# Patient Record
Sex: Male | Born: 1985 | Race: Black or African American | Hispanic: No | Marital: Single
Health system: Southern US, Community
[De-identification: ages and names within clinical notes are randomized; demographics above are authoritative.]

---

## 2005-07-14 ENCOUNTER — Ambulatory Visit (HOSPITAL_BASED_OUTPATIENT_CLINIC_OR_DEPARTMENT_OTHER): Admission: RE | Admit: 2005-07-14 | Discharge: 2005-07-15 | Payer: Self-pay | Admitting: Orthopedic Surgery

## 2005-07-14 ENCOUNTER — Ambulatory Visit (HOSPITAL_COMMUNITY): Admission: RE | Admit: 2005-07-14 | Discharge: 2005-07-14 | Payer: Self-pay | Admitting: Orthopedic Surgery

## 2006-05-24 ENCOUNTER — Ambulatory Visit (HOSPITAL_COMMUNITY): Admission: RE | Admit: 2006-05-24 | Discharge: 2006-05-24 | Payer: Self-pay | Admitting: Orthopedic Surgery

## 2009-10-08 ENCOUNTER — Emergency Department (HOSPITAL_COMMUNITY): Admission: EM | Admit: 2009-10-08 | Discharge: 2009-10-08 | Payer: Self-pay | Admitting: Emergency Medicine
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
## 2017-07-30 ENCOUNTER — Other Ambulatory Visit: Payer: Self-pay

## 2017-07-30 ENCOUNTER — Emergency Department (HOSPITAL_COMMUNITY): Payer: No Typology Code available for payment source

## 2017-07-30 ENCOUNTER — Emergency Department (HOSPITAL_COMMUNITY)
Admission: EM | Admit: 2017-07-30 | Discharge: 2017-07-30 | Disposition: A | Discharge status: Home / Self Care | Payer: No Typology Code available for payment source | Attending: Emergency Medicine | Admitting: Emergency Medicine

## 2017-07-30 ENCOUNTER — Encounter (HOSPITAL_COMMUNITY): Payer: Self-pay

## 2017-07-30 DIAGNOSIS — S7001XA Contusion of right hip, initial encounter: Secondary | ICD-10-CM

## 2017-07-30 DIAGNOSIS — Y999 Unspecified external cause status: Secondary | ICD-10-CM | POA: Diagnosis not present

## 2017-07-30 DIAGNOSIS — R51 Headache: Secondary | ICD-10-CM | POA: Insufficient documentation

## 2017-07-30 DIAGNOSIS — S79911A Unspecified injury of right hip, initial encounter: Secondary | ICD-10-CM | POA: Diagnosis present

## 2017-07-30 DIAGNOSIS — S7011XA Contusion of right thigh, initial encounter: Secondary | ICD-10-CM | POA: Insufficient documentation

## 2017-07-30 DIAGNOSIS — Y9389 Activity, other specified: Secondary | ICD-10-CM | POA: Diagnosis not present

## 2017-07-30 DIAGNOSIS — R52 Pain, unspecified: Secondary | ICD-10-CM

## 2017-07-30 DIAGNOSIS — R1084 Generalized abdominal pain: Secondary | ICD-10-CM | POA: Diagnosis not present

## 2017-07-30 DIAGNOSIS — Y929 Unspecified place or not applicable: Secondary | ICD-10-CM | POA: Insufficient documentation

## 2017-07-30 LAB — I-STAT CHEM 8, ED
BUN: 14 mg/dL (ref 6–20)
CALCIUM ION: 1.16 mmol/L (ref 1.15–1.40)
Chloride: 107 mmol/L (ref 101–111)
Creatinine, Ser: 1.1 mg/dL (ref 0.61–1.24)
GLUCOSE: 103 mg/dL — AB (ref 65–99)
HCT: 46 % (ref 39.0–52.0)
Hemoglobin: 15.6 g/dL (ref 13.0–17.0)
Potassium: 4.2 mmol/L (ref 3.5–5.1)
SODIUM: 140 mmol/L (ref 135–145)
TCO2: 25 mmol/L (ref 22–32)

## 2017-07-30 LAB — CBC WITH DIFFERENTIAL/PLATELET
Basophils Absolute: 0.1 10*3/uL (ref 0.0–0.1)
Basophils Relative: 1 %
EOS ABS: 0.1 10*3/uL (ref 0.0–0.7)
EOS PCT: 1 %
HCT: 45.4 % (ref 39.0–52.0)
Hemoglobin: 15.2 g/dL (ref 13.0–17.0)
LYMPHS ABS: 2.1 10*3/uL (ref 0.7–4.0)
Lymphocytes Relative: 28 %
MCH: 30.5 pg (ref 26.0–34.0)
MCHC: 33.5 g/dL (ref 30.0–36.0)
MCV: 91.2 fL (ref 78.0–100.0)
MONO ABS: 0.3 10*3/uL (ref 0.1–1.0)
MONOS PCT: 4 %
Neutro Abs: 4.8 10*3/uL (ref 1.7–7.7)
Neutrophils Relative %: 66 %
PLATELETS: 201 10*3/uL (ref 150–400)
RBC: 4.98 MIL/uL (ref 4.22–5.81)
RDW: 13.6 % (ref 11.5–15.5)
WBC: 7.3 10*3/uL (ref 4.0–10.5)

## 2017-07-30 LAB — CBG MONITORING, ED: Glucose-Capillary: 111 mg/dL — ABNORMAL HIGH (ref 65–99)

## 2017-07-30 MED ORDER — FENTANYL CITRATE (PF) 100 MCG/2ML IJ SOLN
50.0000 ug | Freq: Once | INTRAMUSCULAR | Status: AC
  Administered 2017-07-30: 50 ug via INTRAVENOUS
  Filled 2017-07-30: qty 2

## 2017-07-30 MED ORDER — NAPROXEN 250 MG PO TABS
500.0000 mg | ORAL_TABLET | Freq: Once | ORAL | Status: AC
  Administered 2017-07-30: 500 mg via ORAL
  Filled 2017-07-30: qty 2

## 2017-07-30 MED ORDER — LIDOCAINE HCL 1 % IJ SOLN
INTRAMUSCULAR | Status: AC
  Filled 2017-07-30: qty 20

## 2017-07-30 MED ORDER — IBUPROFEN 600 MG PO TABS: 600.0000 mg | ORAL_TABLET | Freq: Four times a day (QID) | ORAL | 0 refills | Status: AC | PRN

## 2017-07-30 MED ORDER — HYDROCODONE-ACETAMINOPHEN 5-325 MG PO TABS
1.0000 | ORAL_TABLET | Freq: Once | ORAL | Status: AC
  Administered 2017-07-30: 1 via ORAL
  Filled 2017-07-30: qty 1

## 2017-07-30 MED ORDER — LIDOCAINE-EPINEPHRINE 1 %-1:100000 IJ SOLN
INTRAMUSCULAR | Status: AC
  Filled 2017-07-30: qty 1
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
## 2017-07-30 NOTE — ED Notes (Signed)
ED Provider at bedside. 
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
## 2017-07-30 NOTE — ED Notes (Signed)
Pt ambulated. Able to bear weight but with difficulty.  MD notified.

## 2017-07-30 NOTE — ED Notes (Signed)
Ice applied

## 2017-07-30 NOTE — Progress Notes (Signed)
Orthopedic Tech Progress Note Patient Details:  Adam FoundHerbert Wilkerson 24-Feb-1986 [id number]  Ortho Devices Type of Ortho Device: Crutches Ortho Device/Splint Interventions: Application   Saul FordyceJennifer C Leanah Kolander 07/30/2017, 11:52 AM
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
## 2017-07-30 NOTE — ED Notes (Signed)
Patient transported to X-ray 
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
